# Patient Record
Sex: Male | Born: 1950 | Race: White | Hispanic: No | Smoking: Former smoker
Health system: Southern US, Community
[De-identification: ages and names within clinical notes are randomized; demographics above are authoritative.]

## PROBLEM LIST (undated history)

## (undated) DIAGNOSIS — L8990 Pressure ulcer of unspecified site, unspecified stage: Secondary | ICD-10-CM

## (undated) DIAGNOSIS — E559 Vitamin D deficiency, unspecified: Secondary | ICD-10-CM

## (undated) DIAGNOSIS — G629 Polyneuropathy, unspecified: Secondary | ICD-10-CM

## (undated) DIAGNOSIS — K4090 Unilateral inguinal hernia, without obstruction or gangrene, not specified as recurrent: Secondary | ICD-10-CM

## (undated) DIAGNOSIS — D638 Anemia in other chronic diseases classified elsewhere: Secondary | ICD-10-CM

## (undated) DIAGNOSIS — I10 Essential (primary) hypertension: Secondary | ICD-10-CM

## (undated) DIAGNOSIS — F329 Major depressive disorder, single episode, unspecified: Secondary | ICD-10-CM

## (undated) DIAGNOSIS — I119 Hypertensive heart disease without heart failure: Secondary | ICD-10-CM

## (undated) DIAGNOSIS — E46 Unspecified protein-calorie malnutrition: Secondary | ICD-10-CM

## (undated) DIAGNOSIS — M6282 Rhabdomyolysis: Secondary | ICD-10-CM

## (undated) DIAGNOSIS — J9690 Respiratory failure, unspecified, unspecified whether with hypoxia or hypercapnia: Secondary | ICD-10-CM

## (undated) DIAGNOSIS — K219 Gastro-esophageal reflux disease without esophagitis: Secondary | ICD-10-CM

## (undated) DIAGNOSIS — J449 Chronic obstructive pulmonary disease, unspecified: Secondary | ICD-10-CM

## (undated) DIAGNOSIS — I5020 Unspecified systolic (congestive) heart failure: Secondary | ICD-10-CM

## (undated) DIAGNOSIS — E039 Hypothyroidism, unspecified: Secondary | ICD-10-CM

## (undated) DIAGNOSIS — J189 Pneumonia, unspecified organism: Secondary | ICD-10-CM

## (undated) HISTORY — PX: PEG TUBE PLACEMENT: SUR1034

---

## 2014-05-17 HISTORY — PX: TRACHEOSTOMY: SUR1362

## 2014-12-28 ENCOUNTER — Encounter (HOSPITAL_COMMUNITY): Payer: Self-pay | Admitting: Physician Assistant

## 2014-12-28 ENCOUNTER — Emergency Department (HOSPITAL_COMMUNITY): Payer: Medicare Other

## 2014-12-28 ENCOUNTER — Inpatient Hospital Stay (HOSPITAL_COMMUNITY): Payer: Medicare Other

## 2014-12-28 ENCOUNTER — Inpatient Hospital Stay (HOSPITAL_COMMUNITY)
Admission: EM | Admit: 2014-12-28 | Discharge: 2015-01-15 | DRG: 296 | Disposition: E | Payer: Medicare Other | Attending: Pulmonary Disease | Admitting: Pulmonary Disease

## 2014-12-28 DIAGNOSIS — E872 Acidosis: Secondary | ICD-10-CM | POA: Diagnosis present

## 2014-12-28 DIAGNOSIS — I110 Hypertensive heart disease with heart failure: Secondary | ICD-10-CM | POA: Diagnosis present

## 2014-12-28 DIAGNOSIS — I442 Atrioventricular block, complete: Secondary | ICD-10-CM | POA: Diagnosis present

## 2014-12-28 DIAGNOSIS — J9600 Acute respiratory failure, unspecified whether with hypoxia or hypercapnia: Secondary | ICD-10-CM | POA: Diagnosis present

## 2014-12-28 DIAGNOSIS — E039 Hypothyroidism, unspecified: Secondary | ICD-10-CM | POA: Diagnosis present

## 2014-12-28 DIAGNOSIS — J9601 Acute respiratory failure with hypoxia: Secondary | ICD-10-CM | POA: Diagnosis present

## 2014-12-28 DIAGNOSIS — I5022 Chronic systolic (congestive) heart failure: Secondary | ICD-10-CM | POA: Diagnosis present

## 2014-12-28 DIAGNOSIS — F329 Major depressive disorder, single episode, unspecified: Secondary | ICD-10-CM | POA: Diagnosis present

## 2014-12-28 DIAGNOSIS — K7200 Acute and subacute hepatic failure without coma: Secondary | ICD-10-CM | POA: Diagnosis present

## 2014-12-28 DIAGNOSIS — D638 Anemia in other chronic diseases classified elsewhere: Secondary | ICD-10-CM | POA: Diagnosis present

## 2014-12-28 DIAGNOSIS — E559 Vitamin D deficiency, unspecified: Secondary | ICD-10-CM | POA: Diagnosis present

## 2014-12-28 DIAGNOSIS — Z87891 Personal history of nicotine dependence: Secondary | ICD-10-CM

## 2014-12-28 DIAGNOSIS — Z6820 Body mass index (BMI) 20.0-20.9, adult: Secondary | ICD-10-CM

## 2014-12-28 DIAGNOSIS — J449 Chronic obstructive pulmonary disease, unspecified: Secondary | ICD-10-CM | POA: Diagnosis present

## 2014-12-28 DIAGNOSIS — Z66 Do not resuscitate: Secondary | ICD-10-CM | POA: Diagnosis present

## 2014-12-28 DIAGNOSIS — I469 Cardiac arrest, cause unspecified: Secondary | ICD-10-CM | POA: Diagnosis not present

## 2014-12-28 DIAGNOSIS — J939 Pneumothorax, unspecified: Secondary | ICD-10-CM | POA: Diagnosis not present

## 2014-12-28 DIAGNOSIS — G629 Polyneuropathy, unspecified: Secondary | ICD-10-CM | POA: Diagnosis present

## 2014-12-28 DIAGNOSIS — R570 Cardiogenic shock: Secondary | ICD-10-CM | POA: Diagnosis present

## 2014-12-28 DIAGNOSIS — R579 Shock, unspecified: Secondary | ICD-10-CM

## 2014-12-28 DIAGNOSIS — K219 Gastro-esophageal reflux disease without esophagitis: Secondary | ICD-10-CM | POA: Diagnosis present

## 2014-12-28 DIAGNOSIS — R64 Cachexia: Secondary | ICD-10-CM | POA: Diagnosis present

## 2014-12-28 HISTORY — DX: Gastro-esophageal reflux disease without esophagitis: K21.9

## 2014-12-28 HISTORY — DX: Pressure ulcer of unspecified site, unspecified stage: L89.90

## 2014-12-28 HISTORY — DX: Hypertensive heart disease without heart failure: I11.9

## 2014-12-28 HISTORY — DX: Unspecified systolic (congestive) heart failure: I50.20

## 2014-12-28 HISTORY — DX: Respiratory failure, unspecified, unspecified whether with hypoxia or hypercapnia: J96.90

## 2014-12-28 HISTORY — DX: Hypothyroidism, unspecified: E03.9

## 2014-12-28 HISTORY — DX: Unspecified protein-calorie malnutrition: E46

## 2014-12-28 HISTORY — DX: Major depressive disorder, single episode, unspecified: F32.9

## 2014-12-28 HISTORY — DX: Chronic obstructive pulmonary disease, unspecified: J44.9

## 2014-12-28 HISTORY — DX: Rhabdomyolysis: M62.82

## 2014-12-28 HISTORY — DX: Polyneuropathy, unspecified: G62.9

## 2014-12-28 HISTORY — DX: Anemia in other chronic diseases classified elsewhere: D63.8

## 2014-12-28 HISTORY — DX: Essential (primary) hypertension: I10

## 2014-12-28 HISTORY — DX: Unilateral inguinal hernia, without obstruction or gangrene, not specified as recurrent: K40.90

## 2014-12-28 HISTORY — DX: Vitamin D deficiency, unspecified: E55.9

## 2014-12-28 HISTORY — DX: Pneumonia, unspecified organism: J18.9

## 2014-12-28 LAB — POCT I-STAT 3, ART BLOOD GAS (G3+)
ACID-BASE DEFICIT: 16 mmol/L — AB (ref 0.0–2.0)
Acid-base deficit: 20 mmol/L — ABNORMAL HIGH (ref 0.0–2.0)
BICARBONATE: 10.8 meq/L — AB (ref 20.0–24.0)
BICARBONATE: 12.3 meq/L — AB (ref 20.0–24.0)
O2 Saturation: 96 %
O2 Saturation: 95 %
PCO2 ART: 44.6 mmHg (ref 35.0–45.0)
TCO2: 12 mmol/L (ref 0–100)
TCO2: 13 mmol/L (ref 0–100)
pCO2 arterial: 35.2 mmHg (ref 35.0–45.0)
pH, Arterial: 6.993 — CL (ref 7.350–7.450)
pH, Arterial: 7.133 — CL (ref 7.350–7.450)
pO2, Arterial: 118 mmHg — ABNORMAL HIGH (ref 80.0–100.0)
pO2, Arterial: 84 mmHg (ref 80.0–100.0)

## 2014-12-28 LAB — COMPREHENSIVE METABOLIC PANEL
ALBUMIN: 1.3 g/dL — AB (ref 3.5–5.0)
ALT: 718 U/L — ABNORMAL HIGH (ref 17–63)
AST: 869 U/L — AB (ref 15–41)
Alkaline Phosphatase: 129 U/L — ABNORMAL HIGH (ref 38–126)
Anion gap: 19 — ABNORMAL HIGH (ref 5–15)
BUN: 44 mg/dL — AB (ref 6–20)
CHLORIDE: 111 mmol/L (ref 101–111)
CO2: 14 mmol/L — AB (ref 22–32)
Calcium: 7.2 mg/dL — ABNORMAL LOW (ref 8.9–10.3)
Creatinine, Ser: 1.1 mg/dL (ref 0.61–1.24)
GFR calc Af Amer: >60 mL/min (ref >60)
GLUCOSE: 148 mg/dL — AB (ref 65–99)
POTASSIUM: 4.4 mmol/L (ref 3.5–5.1)
SODIUM: 144 mmol/L (ref 135–145)
Total Bilirubin: 0.8 mg/dL (ref 0.3–1.2)
Total Protein: 4.2 g/dL — ABNORMAL LOW (ref 6.5–8.1)

## 2014-12-28 LAB — I-STAT ARTERIAL BLOOD GAS, ED
Acid-base deficit: 16 mmol/L — ABNORMAL HIGH (ref 0.0–2.0)
BICARBONATE: 14.6 meq/L — AB (ref 20.0–24.0)
O2 Saturation: 96 %
TCO2: 16 mmol/L (ref 0–100)
pCO2 arterial: 53.6 mmHg — ABNORMAL HIGH (ref 35.0–45.0)
pH, Arterial: 7.042 — CL (ref 7.350–7.450)
pO2, Arterial: 113 mmHg — ABNORMAL HIGH (ref 80.0–100.0)

## 2014-12-28 LAB — CBC WITH DIFFERENTIAL/PLATELET
BASOS ABS: 0.1 10*3/uL (ref 0.0–0.1)
Basophils Relative: 1 % (ref 0–1)
EOS ABS: 0 10*3/uL (ref 0.0–0.7)
Eosinophils Relative: 0 % (ref 0–5)
HCT: 30.9 % — ABNORMAL LOW (ref 39.0–52.0)
Hemoglobin: 8.9 g/dL — ABNORMAL LOW (ref 13.0–17.0)
LYMPHS PCT: 7 % — AB (ref 12–46)
Lymphs Abs: 0.8 10*3/uL (ref 0.7–4.0)
MCH: 28.4 pg (ref 26.0–34.0)
MCHC: 28.8 g/dL — ABNORMAL LOW (ref 30.0–36.0)
MCV: 98.7 fL (ref 78.0–100.0)
MONO ABS: 0.3 10*3/uL (ref 0.1–1.0)
Monocytes Relative: 3 % (ref 3–12)
NEUTROS PCT: 89 % — AB (ref 43–77)
Neutro Abs: 9.6 10*3/uL — ABNORMAL HIGH (ref 1.7–7.7)
PLATELETS: 143 10*3/uL — AB (ref 150–400)
RBC: 3.13 MIL/uL — AB (ref 4.22–5.81)
RDW: 17.3 % — AB (ref 11.5–15.5)
WBC: 10.8 10*3/uL — AB (ref 4.0–10.5)

## 2014-12-28 LAB — I-STAT CG4 LACTIC ACID, ED: Lactic Acid, Venous: 14.82 mmol/L (ref 0.5–2.0)

## 2014-12-28 LAB — I-STAT CHEM 8, ED
BUN: 44 mg/dL — AB (ref 6–20)
CALCIUM ION: 0.97 mmol/L — AB (ref 1.13–1.30)
CHLORIDE: 110 mmol/L (ref 101–111)
CREATININE: 0.9 mg/dL (ref 0.61–1.24)
Glucose, Bld: 137 mg/dL — ABNORMAL HIGH (ref 65–99)
HCT: 27 % — ABNORMAL LOW (ref 39.0–52.0)
Hemoglobin: 9.2 g/dL — ABNORMAL LOW (ref 13.0–17.0)
Potassium: 4.3 mmol/L (ref 3.5–5.1)
SODIUM: 144 mmol/L (ref 135–145)
TCO2: 14 mmol/L (ref 0–100)

## 2014-12-28 LAB — LACTIC ACID, PLASMA: LACTIC ACID, VENOUS: 15.5 mmol/L — AB (ref 0.5–2.0)

## 2014-12-28 LAB — TROPONIN I: TROPONIN I: 0.38 ng/mL — AB (ref <0.031)

## 2014-12-28 LAB — MRSA PCR SCREENING: MRSA BY PCR: NEGATIVE

## 2014-12-28 MED ORDER — ONDANSETRON HCL 4 MG/2ML IJ SOLN
4.0000 mg | Freq: Four times a day (QID) | INTRAMUSCULAR | Status: DC | PRN
Start: 2014-12-28 — End: 2014-12-29

## 2014-12-28 MED ORDER — SODIUM BICARBONATE 8.4 % IV SOLN
100.0000 meq | Freq: Once | INTRAVENOUS | Status: AC
  Administered 2014-12-28: 100 meq via INTRAVENOUS
  Filled 2014-12-28: qty 50

## 2014-12-28 MED ORDER — SODIUM CHLORIDE 0.9 % IV SOLN: 250.0000 mL | INTRAVENOUS | Status: DC | PRN

## 2014-12-28 MED ORDER — ASPIRIN 81 MG PO CHEW: 324.0000 mg | CHEWABLE_TABLET | ORAL | Status: AC

## 2014-12-28 MED ORDER — FENTANYL CITRATE (PF) 100 MCG/2ML IJ SOLN
25.0000 ug | INTRAMUSCULAR | Status: DC | PRN
  Administered 2014-12-28 (×2): 50 ug via INTRAVENOUS
  Filled 2014-12-28: qty 2

## 2014-12-28 MED ORDER — SODIUM CHLORIDE 0.9 % IV SOLN
1000.0000 mL | Freq: Once | INTRAVENOUS | Status: AC
  Administered 2014-12-28: 1000 mL via INTRAVENOUS

## 2014-12-28 MED ORDER — IPRATROPIUM-ALBUTEROL 0.5-2.5 (3) MG/3ML IN SOLN
3.0000 mL | Freq: Four times a day (QID) | RESPIRATORY_TRACT | Status: DC
  Administered 2014-12-28: 3 mL via RESPIRATORY_TRACT
  Filled 2014-12-28 (×2): qty 3

## 2014-12-28 MED ORDER — PANTOPRAZOLE SODIUM 40 MG IV SOLR: 40.0000 mg | Freq: Every day | INTRAVENOUS | Status: DC

## 2014-12-28 MED ORDER — DEXTROSE 5 % IV SOLN
2.0000 ug/min | INTRAVENOUS | Status: DC
  Administered 2014-12-28: 30 ug/min via INTRAVENOUS
  Filled 2014-12-28: qty 4

## 2014-12-28 MED ORDER — SODIUM CHLORIDE 0.9 % IV BOLUS (SEPSIS)
1000.0000 mL | Freq: Once | INTRAVENOUS | Status: AC
  Administered 2014-12-28: 1000 mL via INTRAVENOUS

## 2014-12-28 MED ORDER — MIDAZOLAM HCL 2 MG/2ML IJ SOLN
INTRAMUSCULAR | Status: AC
  Administered 2014-12-28: 2 mg
  Filled 2014-12-28: qty 2

## 2014-12-28 MED ORDER — SODIUM CHLORIDE 0.9 % IV SOLN
1000.0000 mL | INTRAVENOUS | Status: DC
  Administered 2014-12-28: 1000 mL via INTRAVENOUS

## 2014-12-28 MED ORDER — NOREPINEPHRINE BITARTRATE 1 MG/ML IV SOLN
5.0000 ug/min | INTRAVENOUS | Status: DC
  Administered 2014-12-28: 45 ug/min via INTRAVENOUS
  Filled 2014-12-28 (×2): qty 16

## 2014-12-28 MED ORDER — SODIUM BICARBONATE 8.4 % IV SOLN
INTRAVENOUS | Status: DC
  Administered 2014-12-28: 23:00:00 via INTRAVENOUS
  Filled 2014-12-28 (×3): qty 150

## 2014-12-28 MED ORDER — ASPIRIN 300 MG RE SUPP
300.0000 mg | RECTAL | Status: AC
  Administered 2014-12-28: 300 mg via RECTAL
  Filled 2014-12-28: qty 1

## 2014-12-28 MED ORDER — MIDAZOLAM HCL 2 MG/2ML IJ SOLN
1.0000 mg | INTRAMUSCULAR | Status: DC | PRN
  Filled 2014-12-28 (×2): qty 2

## 2014-12-28 MED ORDER — PANTOPRAZOLE SODIUM 40 MG IV SOLR
40.0000 mg | Freq: Two times a day (BID) | INTRAVENOUS | Status: DC
  Administered 2014-12-28: 40 mg via INTRAVENOUS
  Filled 2014-12-28: qty 40

## 2014-12-28 MED ORDER — SODIUM BICARBONATE 8.4 % IV SOLN
INTRAVENOUS | Status: AC
  Filled 2014-12-28: qty 50

## 2014-12-28 MED ORDER — NOREPINEPHRINE BITARTRATE 1 MG/ML IV SOLN
5.0000 ug/min | INTRAVENOUS | Status: DC
  Filled 2014-12-28: qty 4

## 2014-12-28 NOTE — H&P (Signed)
PULMONARY / CRITICAL CARE MEDICINE   Name: Tim Moore MRN: [id number] DOB: 11-Jul-1950    ADMISSION DATE:  12/19/2014 CONSULTATION DATE:  01/12/2015  REFERRING MD :  ED  CHIEF COMPLAINT:  Cardiogenic Shock  INITIAL PRESENTATION: Patient was found down for an unclear duration of time at his long-term acute care facility ([facility]). Patient underwent approximately 6 minutes of CPR with epinephrine prior to resuscitation.Patient was transferred to our facility after endotracheal intubation, placement of right femoral central venous line, and initiation of transcutaneous pacing.  STUDIES:  Portable CXR 9/13 - Moderate left pneumothorax.  SIGNIFICANT EVENTS: 9/13 - Found down unclear duration 9/13 - Cardiogenic shock requiring transcutaneous pacing & vasopressors 9/13 - Left Chest tube placed for pneumothorax on ventilator  HISTORY OF PRESENT ILLNESS:  Patient's history obtained from his medical records copy from his long-term acute care facility. I also obtained history from the patient's brother and mother via phone this evening. Patient has a known history of depression and apparently had a gradual decline in strength and conditioning over approximately 3 months prior to admission to came the hospital and will also [place] in February 2016. The patient was found lying on the floor the morning of admission and was noted to be in rhabdomyolysis. His course was complicated by respiratory failure from Pseudomonas pneumonia and prolonged ventilation requiring placement of tracheostomy as well as percutaneous gastrostomy tube. The patient did have surgical revision of his tracheostomy since admission to his long-term acute care facility. Also the patient has previously underwent debridement of his sacral decubitus ulcer by general surgery. Per the patient's family he was speaking to them via phone and they visited infrequently. Per their report he was not getting out of bed or walking on his  own. The patient was found down for an unclear duration of time and after resuscitation and arrival by EMS transcutaneous pacing was initiated. He was subsequently transferred to our facility for further acute care needs. On arrival the patient was in a state of shock and hypoxic respiratory failure. Patient was on vasopressor support.  PAST MEDICAL HISTORY :  Past Medical History  Diagnosis Date  . PNA (pneumonia)   . Decubitus skin ulcer   . Vitamin D deficiency   . GERD (gastroesophageal reflux disease)   . Hypothyroid   . Respiratory failure   . Hypertensive heart disease   . Rhabdomyolysis     During Hospital Admission to [place], [place]  . Systolic CHF   . Peripheral neuropathy   . Protein calorie malnutrition   . COPD (chronic obstructive pulmonary disease)   . Anemia of chronic disease   . Major depression   . Hypertension   . Inguinal hernia    SURGICAL HISTORY: Past Surgical History  Procedure Laterality Date  . Tracheostomy  05/2014    later surgically closed  . Peg tube placement     Prior to Admission medications   Medication Sig Start Date End Date Taking? Authorizing Provider  acetaminophen (TYLENOL) 650 MG CR tablet 650 mg by PEG Tube route every 12 (twelve) hours.   Yes Historical Provider, MD  antiseptic oral rinse (BIOTENE) LIQD 15 mLs by Mouth Rinse route as needed for dry mouth (secretions).   Yes Historical Provider, MD  Cholecalciferol (VITAMIN D) 2000 UNITS CAPS 2,000 Units by PEG Tube route daily.   Yes Historical Provider, MD  clonazePAM (KLONOPIN) 0.25 MG disintegrating tablet 0.25 mg by PEG Tube route 2 (two) times daily.   Yes Historical Provider, MD  famotidine (PEPCID) 20 MG tablet 20 mg by PEG Tube route 2 (two) times daily.   Yes Historical Provider, MD  FentaNYL 37.5 MCG/HR PT72 Place 1 patch onto the skin every 3 (three) days.   Yes Historical Provider, MD  ferrous sulfate 300 (60 FE) MG/5ML syrup Place 300 mg into feeding tube daily.   Yes  Historical Provider, MD  glycopyrrolate (ROBINUL) 2 MG tablet 2 mg by PEG Tube route 2 (two) times daily.   Yes Historical Provider, MD  ibuprofen (ADVIL,MOTRIN) 200 MG tablet Take 200 mg by mouth 2 (two) times daily.   Yes Historical Provider, MD  levothyroxine (SYNTHROID, LEVOTHROID) 75 MCG tablet 75 mcg by PEG Tube route daily before breakfast.   Yes Historical Provider, MD  magnesium hydroxide (MILK OF MAGNESIA) 400 MG/5ML suspension Take 30 mLs by mouth daily as needed for mild constipation.   Yes Historical Provider, MD  metoCLOPramide (REGLAN) 10 MG tablet 10 mg by PEG Tube route every 8 (eight) hours.   Yes Historical Provider, MD  metoprolol tartrate (LOPRESSOR) 25 MG tablet 12.5 mg by PEG Tube route 2 (two) times daily.   Yes Historical Provider, MD  morphine 10 MG/5ML solution Take 15 mg by mouth every 6 (six) hours.   Yes Historical Provider, MD  morphine 2 MG/ML injection Inject 2 mg into the muscle every 4 (four) hours as needed (wound care).   Yes Historical Provider, MD  Multiple Vitamins-Minerals (MULTIVITAMIN WITH MINERALS) tablet 1 tablet by PEG Tube route daily.   Yes Historical Provider, MD  potassium chloride 20 MEQ/15ML (10%) SOLN 20 mEq by PEG Tube route 2 (two) times daily.   Yes Historical Provider, MD  pregabalin (LYRICA) 150 MG capsule 150 mg by PEG Tube route 2 (two) times daily.   Yes Historical Provider, MD  sertraline (ZOLOFT) 20 MG/ML concentrated solution Place 50 mg into feeding tube daily.   Yes Historical Provider, MD  silver nitrate applicators 75-25 % applicator Apply 1 application topically as needed for bleeding.   Yes Historical Provider, MD  sodium hypochlorite (DAKIN'S 1/4 STRENGTH) 0.125 % SOLN Apply 1 application topically daily.   Yes Historical Provider, MD  tiZANidine (ZANAFLEX) 4 MG tablet 4 mg by PEG Tube route 3 (three) times daily.   Yes Historical Provider, MD   Allergies  Allergen Reactions  . Penicillins     FAMILY HISTORY:  Family  History  Problem Relation Age of Onset  . Lung cancer    . Breast cancer     SOCIAL HISTORY: Social History   Social History  . Marital Status: N/A    Spouse Name: N/A  . Number of Children: N/A  . Years of Education: N/A   Social History Main Topics  . Smoking status: Former Smoker -- 1.00 packs/day for 40 years    Types: Cigarettes    Quit date: 05/17/2014  . Smokeless tobacco: Never Used  . Alcohol Use: Not on file  . Drug Use: Not on file  . Sexual Activity: Not on file   Other Topics Concern  . Not on file   Social History Narrative   Admitted to [facility] from [place], [place]. Patient previously living at home with brother & mother.    REVIEW OF SYSTEMS:  Unobtainable as the patient is currently intubated.  SUBJECTIVE:   VITAL SIGNS: Pulse Rate:  [70-72] 72 (09/13 2135) Resp:  [26-38] 33 (09/13 2135) BP: (56-186)/(15-135) 83/39 mmHg (09/13 1953) SpO2:  [80 %-100 %] 95 % (09/13 2135) Arterial  Line BP: (92-114)/(46-65) 103/57 mmHg (09/13 2016) FiO2 (%):  [100 %] 100 % (09/13 2142) Weight:  [61.8 kg (136 lb 3.9 oz)] 61.8 kg (136 lb 3.9 oz) (09/13 2100) HEMODYNAMICS:   VENTILATOR SETTINGS: Vent Mode:  [-] PRVC FiO2 (%):  [100 %] 100 % Set Rate:  [16 bmp-30 bmp] 30 bmp Vt Set:  [550 mL] 550 mL PEEP:  [5 cmH20] 5 cmH20 Plateau Pressure:  [20 cmH20-28 cmH20] 22 cmH20 INTAKE / OUTPUT: No intake or output data in the 24 hours ending 01/10/2015 2154  PHYSICAL EXAMINATION: General: No acute distress. Laying recumbent in bed on ventilator. No spontaneous movements.  Integument:  Warm & dry. No rash on exposed skin. Bruising of various ages on exposed extremities. Multiple decubiti on the patient's extremities as well as on the sacrum. Lymphatics:  No appreciated cervical or supraclavicular lymphadenoapthy. HEENT:  tachycardia mucus membranes. Endotracheal tube in place. Pupils equal. Cardiovascular:  Regular rate and transcutaneously paced. No edema.  Pulmonary:   Decreased breath sounds on the left. Absence of lung sliding on bedside thoracic ultrasound on the left. Currently on 1.0 FiO2. Abdomen: Soft. Normal bowel sounds. Nondistended. Large inguinal hernia noted within scrotum. Musculoskeletal:  Patient had plantarflexion contractures of bilateral feet. No obvious joint deformity. Neurological:  Patient not following commands. Grossly symmetric face. After my chest tube insertion he did attempt to make one brief movement with his left side. Psychiatric:  Patient is non-communicative. Did receive IV sedative by EMS.  LABS:  CBC  Recent Labs Lab 01/07/2015 1824 01/02/2015 1825  WBC 10.8*  --   HGB 8.9* 9.2*  HCT 30.9* 27.0*  PLT 143*  --    Coag's No results for input(s): APTT, INR in the last 168 hours. BMET  Recent Labs Lab 01/01/2015 1824 01/02/2015 1825  NA 144 144  K 4.4 4.3  CL 111 110  CO2 14*  --   BUN 44* 44*  CREATININE 1.10 0.90  GLUCOSE 148* 137*   Electrolytes  Recent Labs Lab 01/04/2015 1824  CALCIUM 7.2*   Sepsis Markers  Recent Labs Lab 01/08/2015 1826  LATICACIDVEN 14.82*   ABG  Recent Labs Lab 01/07/2015 1904 12/21/2014 2131  PHART 7.042* 6.993*  PCO2ART 53.6* 44.6  PO2ART 113.0* 118.0*   Liver Enzymes  Recent Labs Lab 01/10/2015 1824  AST 869*  ALT 718*  ALKPHOS 129*  BILITOT 0.8  ALBUMIN 1.3*   Cardiac Enzymes No results for input(s): TROPONINI, PROBNP in the last 168 hours. Glucose No results for input(s): GLUCAP in the last 168 hours.  Imaging Ct Head Wo Contrast  01/11/2015   CLINICAL DATA:  64 year old male status post cardiac arrest and CPR  EXAM: CT HEAD WITHOUT CONTRAST  TECHNIQUE: Contiguous axial images were obtained from the base of the skull through the vertex without intravenous contrast.  COMPARISON:  None.  FINDINGS: There is slight prominence of the ventricles and sulci compatible with age-related volume loss. Mild periventricular and deep white matter hypodensities represent  chronic microvascular ischemic changes. There is no intracranial hemorrhage. No mass effect or midline shift identified.  There is partial opacification of the sphenoid sinuses with minimal mucoperiosteal thickening of the left maxillary sinus. The calvarium is intact.  IMPRESSION: No acute intracranial pathology.  Mild age-related atrophy and chronic microvascular ischemic disease.  If symptoms persist and there are no contraindications, MRI may provide better evaluation if clinically indicated   Electronically Signed   By: Elgie Collard M.D.   On: 12/17/2014 20:55  Dg Chest Portable 1 View  2015/01/05   CLINICAL DATA:  64 year old male with cardiac arrest and chest tube placement.  EXAM: PORTABLE CHEST - 1 VIEW  COMPARISON:  Earlier radiograph dated 01-05-2015  FINDINGS: An endotracheal tube is noted with tip in stable position above the carina. There has been interval placement of a pigtail chest tube with tip projecting over the mediastinum at the level of the carina. There has been interval improvement of the previously seen pneumothorax with probable small residual pneumothorax at the left apex. Follow-up recommended.  Bilateral patchy airspace opacity again noted similar to prior study. Stable cardiac silhouette. The osseous structures are grossly unremarkable. Left chest wall soft tissue emphysema adjacent to the entry of the chest tube.  IMPRESSION: Interval placement of a chest tube with tip projecting over the mediastinum. There has been interval decrease in the size of the left pneumothorax with possible residual pneumothorax at the left apical region. Follow-up recommended.   Electronically Signed   By: Elgie Collard M.D.   On: 05-Jan-2015 20:03   Dg Chest Port 1 View  2015-01-05   CLINICAL DATA:  Post CPR with intubation.  EXAM: PORTABLE CHEST - 1 VIEW  COMPARISON:  None.  FINDINGS: Endotracheal tube is present with tip approximately 5.2 cm above the carina. Lungs are hypoinflated with  bilateral central mixed interstitial airspace density which may be partially chronic although cannot exclude acute component of edema versus infection. Small to moderate left-sided pneumothorax. Cardiomediastinal silhouette is within normal. No definite rib fractures seen.  IMPRESSION: Small to moderate left-sided pneumothorax.  Mixed interstitial airspace density over the central lungs which may be partly chronic although cannot exclude an acute process such as edema or infection.  Endotracheal tube with tip 5.2 cm above the carina.  Critical Value/emergent results were called by telephone at the time of interpretation on 01/05/2015 at 6:12 pm to Dr. Jesusita Oka FLOYD's , nurse, Brigid Re, who verbally acknowledged these results.   Electronically Signed   By: Elberta Fortis M.D.   On: 01/05/2015 18:13     ASSESSMENT / PLAN:  PULMONARY OETT 9/13>> L 52fr Pigtail CT 9/13>> A: Acute Hypoxic Respiratory Failure Left Pneumothorax - tube overlying mediastinum on CXR  P:   Chest to 20 cm wall suction Stat chest CT without contrast to confirm tube in position Vent bundle Duonebs every 6 hours  CARDIOVASCULAR R Femoral CVL (placed OSH) 9/13>> L Femoral Art Line 9/13>>> A:  Cardiogenic shock Transcutaneous pacing  P:  Cardiology consulted. Continuing transcutaneous pacing. Echocardiogram ordered. Continuing norepinephrine to maintain MAP >65  RENAL A:   Lactic acidosis.  P:   Trending lactic acid every 6 hours. Monitor urine output with Foley catheter. Monitor renal function with daily BUN/creatinine. Monitoring electrolytes daily. Starting bicarbonate drip.  GASTROINTESTINAL A:   Inguinal hernia. No acute issues.  P:   Nothing by mouth Protonix IV daily  HEMATOLOGIC A:   H/O Anemia of chronic disease - no signs of active bleeding.  P:  SCDs  INFECTIOUS A:  No acute issues.  P:   Plan to perform cultures for leukocytosis/fever.  ENDOCRINE A:   No acute issues.     P:   Monitor blood glucose on daily BMP.  NEUROLOGIC A:  Altered mental status - likely secondary to acute anoxic brain injury  P:   RASS goal: 0 Versed IV when necessary Fentanyl IV when necessary Neuro checks every hour   FAMILY  - Updates: I have updated the patient's  brother Tim Moore [phone]) via phone twice this evening. I also had the opportunity to speak with the patient's mother. I explained to the patient has worsening lactic acidosis likely due to poor perfusion in the state of cardiogenic shock. Patient is currently being transcutaneously paced. I explained that given the severity of his current clinical state and severe debilitation with multiple medical problems I do not expect him to survive this admission.  His brother has agreed that he would not wish to live like this but would like to continue maintaining her current level of care at this time. The patient will be made DO NOT RESUSCITATE at this time in the event of full cardiac arrest.  - Inter-disciplinary family meet or Palliative Care meeting due by:  9/19  TODAY'S SUMMARY: 63 year old male admitted after cardiac arrest being found down for unclear rate of time and undergoing approximately 6 minutes of CPR prior to return of spontaneous circulation. Patient had a left-sided pneumothorax on positive pressure ventilation and a left-sided percutaneous chest tube was emergently placed by myself. Given the need for assessment of arterial pH and ventilation as well as oxygenation a left femoral arterial line was emergently placed. The patient at this time is DO NOT RESUSCITATE and we will not be escalating vasopressor support or transcutaneous pacemaker support at this time. Plan for wound care consult for patient's decubiti if he survives the night.  I have spent a total of 47 minutes of critical care time today independent of procedures in the care of this patient, reviewing the patient's electronic and transfer  medical records, & also having repeated family discussions and updates regarding CODE STATUS with the patient's brother via phone.  Donna Christen Jamison Neighbor, M.D. [facility] Pulmonary [facility] Critical Care Pager:  [phone] After 3pm or if no response, call [phone] 01/01/2015, 9:54 PM

## 2014-12-28 NOTE — ED Notes (Signed)
Pt here from [facility] nursing home post cpr , pt was an unwitnessed arrest  Pt received 2 epis 1 atropine , 1 bicarb, and approx of cpr , pt intubated at [facility], EMS gave d 50 and paced pt  Due to third degree block, pt arrived to ED intubated and being paced

## 2014-12-28 NOTE — ED Notes (Signed)
Pt to go to ct and then 2heart.

## 2014-12-28 NOTE — Procedures (Signed)
Arterial Line Insertion  Consent: Procedure was performed emergently due to inaccuracy of cough pressure, need of arterial blood gas analysis, & symptomatic bradycardia requiring transcutaneous pacing.  Location:  Left Femoral Artery  Medication: Local anesthesia with 5 mL of lidocaine 1%  Description of Procedure: Patient was prepped and draped in sterile fashion left femoral artery palpated. Local anesthesia applied subcutaneously. Needle inserted in the left femoral artery with pulsatile blood flow observed. Guidewire then inserted through the needle. Femoral arterial catheter inserted over guidewire and guidewire removed. Femoral arterial catheter sewn into place. Site dressed in sterile fashion. Arterial waveform transduced.

## 2014-12-28 NOTE — ED Notes (Signed)
Attempted report 

## 2014-12-28 NOTE — Progress Notes (Signed)
CRITICAL VALUE ALERT  Critical value received:  Lactic acid  Date of notification:  12/25/2014  Time of notification:  2130  Critical value read back:Yes.    Nurse who received alert:  Alycia Rossetti   MD notified (1st page):  E-Link MD  Time of first page: 2135    Responding MD:  E-Link MD  Time MD responded:  2135

## 2014-12-28 NOTE — ED Provider Notes (Signed)
CSN: [id number]     Arrival date & time 12/20/2014  1729 History   First MD Initiated Contact with Patient 12/26/2014 1736     Chief Complaint  Patient presents with  . Cardiac Arrest     (Consider location/radiation/quality/duration/timing/severity/associated sxs/prior Treatment) HPI  Level V caveat unable to provide history patient is intubated  Per history patient was at [facility] after he was at a prolonged ventilator required state. Patient had his trach removed about a week ago. Was found to be pulseless today. CPR for about 10 minutes with ROSC, patient was intubated had a central line placed and then was transferred here. Patient bradycardic after CPR and given atropine. EMS in route felt the patient was in third-degree heart block and started pacing him. Past Medical History  Diagnosis Date  . PNA (pneumonia)   . Decubitus skin ulcer   . Vitamin D deficiency   . GERD (gastroesophageal reflux disease)   . Hypothyroid   . Respiratory failure   . Hypertensive heart disease   . Rhabdomyolysis     During Hospital Admission to [place], [place]  . Systolic CHF   . Peripheral neuropathy   . Protein calorie malnutrition   . COPD (chronic obstructive pulmonary disease)   . Anemia of chronic disease   . Major depression   . Hypertension   . Inguinal hernia    Past Surgical History  Procedure Laterality Date  . Tracheostomy  05/2014    later surgically closed  . Peg tube placement     Family History  Problem Relation Age of Onset  . Lung cancer    . Breast cancer     Social History  Substance Use Topics  . Smoking status: Former Smoker -- 1.00 packs/day for 40 years    Types: Cigarettes    Quit date: 05/17/2014  . Smokeless tobacco: Never Used  . Alcohol Use: Not on file    Review of Systems  Level V caveat unable to obtain secondary to patient being intubated  Allergies  Penicillins  Home Medications   Prior to Admission medications   Medication Sig  Start Date End Date Taking? Authorizing Provider  acetaminophen (TYLENOL) 650 MG CR tablet 650 mg by PEG Tube route every 12 (twelve) hours.   Yes Historical Provider, MD  antiseptic oral rinse (BIOTENE) LIQD 15 mLs by Mouth Rinse route as needed for dry mouth (secretions).   Yes Historical Provider, MD  Cholecalciferol (VITAMIN D) 2000 UNITS CAPS 2,000 Units by PEG Tube route daily.   Yes Historical Provider, MD  clonazePAM (KLONOPIN) 0.25 MG disintegrating tablet 0.25 mg by PEG Tube route 2 (two) times daily.   Yes Historical Provider, MD  famotidine (PEPCID) 20 MG tablet 20 mg by PEG Tube route 2 (two) times daily.   Yes Historical Provider, MD  FentaNYL 37.5 MCG/HR PT72 Place 1 patch onto the skin every 3 (three) days.   Yes Historical Provider, MD  ferrous sulfate 300 (60 FE) MG/5ML syrup Place 300 mg into feeding tube daily.   Yes Historical Provider, MD  glycopyrrolate (ROBINUL) 2 MG tablet 2 mg by PEG Tube route 2 (two) times daily.   Yes Historical Provider, MD  ibuprofen (ADVIL,MOTRIN) 200 MG tablet Take 200 mg by mouth 2 (two) times daily.   Yes Historical Provider, MD  levothyroxine (SYNTHROID, LEVOTHROID) 75 MCG tablet 75 mcg by PEG Tube route daily before breakfast.   Yes Historical Provider, MD  magnesium hydroxide (MILK OF MAGNESIA) 400 MG/5ML suspension  Take 30 mLs by mouth daily as needed for mild constipation.   Yes Historical Provider, MD  metoCLOPramide (REGLAN) 10 MG tablet 10 mg by PEG Tube route every 8 (eight) hours.   Yes Historical Provider, MD  metoprolol tartrate (LOPRESSOR) 25 MG tablet 12.5 mg by PEG Tube route 2 (two) times daily.   Yes Historical Provider, MD  morphine 10 MG/5ML solution Take 15 mg by mouth every 6 (six) hours.   Yes Historical Provider, MD  morphine 2 MG/ML injection Inject 2 mg into the muscle every 4 (four) hours as needed (wound care).   Yes Historical Provider, MD  Multiple Vitamins-Minerals (MULTIVITAMIN WITH MINERALS) tablet 1 tablet by PEG  Tube route daily.   Yes Historical Provider, MD  potassium chloride 20 MEQ/15ML (10%) SOLN 20 mEq by PEG Tube route 2 (two) times daily.   Yes Historical Provider, MD  pregabalin (LYRICA) 150 MG capsule 150 mg by PEG Tube route 2 (two) times daily.   Yes Historical Provider, MD  sertraline (ZOLOFT) 20 MG/ML concentrated solution Place 50 mg into feeding tube daily.   Yes Historical Provider, MD  silver nitrate applicators 75-25 % applicator Apply 1 application topically as needed for bleeding.   Yes Historical Provider, MD  sodium hypochlorite (DAKIN'S 1/4 STRENGTH) 0.125 % SOLN Apply 1 application topically daily.   Yes Historical Provider, MD  tiZANidine (ZANAFLEX) 4 MG tablet 4 mg by PEG Tube route 3 (three) times daily.   Yes Historical Provider, MD   BP 101/49 mmHg  Pulse 72  Temp(Src) 93.5 F (34.2 C) (Rectal)  Resp 35  Ht 5\' 8"  (1.727 m)  Wt 136 lb 3.9 oz (61.8 kg)  BMI 20.72 kg/m2  SpO2 95% Physical Exam  Constitutional:  Cachectic  HENT:  Head: Normocephalic and atraumatic.  Old trach scar in place  Eyes: EOM are normal. Pupils are equal, round, and reactive to light.  Neck: Normal range of motion. Neck supple. No JVD present.  Cardiovascular: Normal rate and regular rhythm.  Exam reveals no gallop and no friction rub.   No murmur heard. Pulmonary/Chest: No respiratory distress. He has no wheezes.  Diminished breath sounds on the left  Abdominal: He exhibits mass (large indirect inguinal hernia). He exhibits no distension. There is no tenderness. There is no rebound and no guarding.  Genitourinary:  Blood at the urinary meatus  Musculoskeletal: Normal range of motion.  Neurological: He is unresponsive. GCS eye subscore is 1. GCS verbal subscore is 1. GCS motor subscore is 4.  Patient with some what appears to be involuntary movements. Pupils are 4+ dilated nonresponsive.  Skin: No rash noted. No pallor.  Psychiatric: He has a normal mood and affect. His behavior is  normal.    ED Course  Procedures (including critical care time) Labs Review Labs Reviewed  CBC WITH DIFFERENTIAL/PLATELET - Abnormal; Notable for the following:    WBC 10.8 (*)    RBC 3.13 (*)    Hemoglobin 8.9 (*)    HCT 30.9 (*)    MCHC 28.8 (*)    RDW 17.3 (*)    Platelets 143 (*)    Neutrophils Relative % 89 (*)    Lymphocytes Relative 7 (*)    Neutro Abs 9.6 (*)    All other components within normal limits  COMPREHENSIVE METABOLIC PANEL - Abnormal; Notable for the following:    CO2 14 (*)    Glucose, Bld 148 (*)    BUN 44 (*)    Calcium 7.2 (*)  Total Protein 4.2 (*)    Albumin 1.3 (*)    AST 869 (*)    ALT 718 (*)    Alkaline Phosphatase 129 (*)    Anion gap 19 (*)    All other components within normal limits  TROPONIN I - Abnormal; Notable for the following:    Troponin I 0.38 (*)    All other components within normal limits  LACTIC ACID, PLASMA - Abnormal; Notable for the following:    Lactic Acid, Venous 15.5 (*)    All other components within normal limits  I-STAT CHEM 8, ED - Abnormal; Notable for the following:    BUN 44 (*)    Glucose, Bld 137 (*)    Calcium, Ion 0.97 (*)    Hemoglobin 9.2 (*)    HCT 27.0 (*)    All other components within normal limits  I-STAT CG4 LACTIC ACID, ED - Abnormal; Notable for the following:    Lactic Acid, Venous 14.82 (*)    All other components within normal limits  I-STAT ARTERIAL BLOOD GAS, ED - Abnormal; Notable for the following:    pH, Arterial 7.042 (*)    pCO2 arterial 53.6 (*)    pO2, Arterial 113.0 (*)    Bicarbonate 14.6 (*)    Acid-base deficit 16.0 (*)    All other components within normal limits  POCT I-STAT 3, ART BLOOD GAS (G3+) - Abnormal; Notable for the following:    pH, Arterial 6.993 (*)    pO2, Arterial 118.0 (*)    Bicarbonate 10.8 (*)    Acid-base deficit 20.0 (*)    All other components within normal limits  POCT I-STAT 3, ART BLOOD GAS (G3+) - Abnormal; Notable for the following:    pH,  Arterial 7.133 (*)    Bicarbonate 12.3 (*)    Acid-base deficit 16.0 (*)    All other components within normal limits  MRSA PCR SCREENING  BLOOD GAS, ARTERIAL  BLOOD GAS, ARTERIAL  TROPONIN I  TROPONIN I  LACTIC ACID, PLASMA  LACTIC ACID, PLASMA  COMPREHENSIVE METABOLIC PANEL  PHOSPHORUS  CBC WITH DIFFERENTIAL/PLATELET  MAGNESIUM  BLOOD GAS, ARTERIAL  I-STAT CG4 LACTIC ACID, ED    Imaging Review Ct Head Wo Contrast  27-Jan-2015   CLINICAL DATA:  64 year old male status post cardiac arrest and CPR  EXAM: CT HEAD WITHOUT CONTRAST  TECHNIQUE: Contiguous axial images were obtained from the base of the skull through the vertex without intravenous contrast.  COMPARISON:  None.  FINDINGS: There is slight prominence of the ventricles and sulci compatible with age-related volume loss. Mild periventricular and deep white matter hypodensities represent chronic microvascular ischemic changes. There is no intracranial hemorrhage. No mass effect or midline shift identified.  There is partial opacification of the sphenoid sinuses with minimal mucoperiosteal thickening of the left maxillary sinus. The calvarium is intact.  IMPRESSION: No acute intracranial pathology.  Mild age-related atrophy and chronic microvascular ischemic disease.  If symptoms persist and there are no contraindications, MRI may provide better evaluation if clinically indicated   Electronically Signed   By: Elgie Collard M.D.   On: 2015/01/27 20:55   Dg Chest Portable 1 View  01-27-2015   CLINICAL DATA:  64 year old male with cardiac arrest and chest tube placement.  EXAM: PORTABLE CHEST - 1 VIEW  COMPARISON:  Earlier radiograph dated 2015/01/27  FINDINGS: An endotracheal tube is noted with tip in stable position above the carina. There has been interval placement of a pigtail chest tube  with tip projecting over the mediastinum at the level of the carina. There has been interval improvement of the previously seen pneumothorax with  probable small residual pneumothorax at the left apex. Follow-up recommended.  Bilateral patchy airspace opacity again noted similar to prior study. Stable cardiac silhouette. The osseous structures are grossly unremarkable. Left chest wall soft tissue emphysema adjacent to the entry of the chest tube.  IMPRESSION: Interval placement of a chest tube with tip projecting over the mediastinum. There has been interval decrease in the size of the left pneumothorax with possible residual pneumothorax at the left apical region. Follow-up recommended.   Electronically Signed   By: Elgie Collard M.D.   On: 12/17/2014 20:03   Dg Chest Port 1 View  12/20/2014   CLINICAL DATA:  Post CPR with intubation.  EXAM: PORTABLE CHEST - 1 VIEW  COMPARISON:  None.  FINDINGS: Endotracheal tube is present with tip approximately 5.2 cm above the carina. Lungs are hypoinflated with bilateral central mixed interstitial airspace density which may be partially chronic although cannot exclude acute component of edema versus infection. Small to moderate left-sided pneumothorax. Cardiomediastinal silhouette is within normal. No definite rib fractures seen.  IMPRESSION: Small to moderate left-sided pneumothorax.  Mixed interstitial airspace density over the central lungs which may be partly chronic although cannot exclude an acute process such as edema or infection.  Endotracheal tube with tip 5.2 cm above the carina.  Critical Value/emergent results were called by telephone at the time of interpretation on 12/17/2014 at 6:12 pm to Dr. Jesusita Oka Magaline Steinberg's , nurse, Brigid Re, who verbally acknowledged these results.   Electronically Signed   By: Elberta Fortis M.D.   On: 01/08/2015 18:13   I have personally reviewed and evaluated these images and lab results as part of my medical decision-making.   EKG Interpretation None      MDM   Final diagnoses:  Cardiac arrest  Pneumothorax on left    64 yo M status post cardiac arrest. ET tube  in correct position on chest x-ray. Pneumothorax on the left. Oxygenating well. Transition from a dirty epinephrine drip to a norepinephrine drip.  Patient without any QRS complexes when pacing stopped. Patient with recurrent P waves. Discussed with cardiology will evaluate bedside. Discussed case with critical care. Crit care will put in a arterial line and a chest tube.  CRITICAL CARE Performed by: Rae Roam   Total critical care time: 35 min  Critical care time was exclusive of separately billable procedures and treating other patients.  Critical care was necessary to treat or prevent imminent or life-threatening deterioration.  Critical care was time spent personally by me on the following activities: development of treatment plan with patient and/or surrogate as well as nursing, discussions with consultants, evaluation of patient's response to treatment, examination of patient, obtaining history from patient or surrogate, ordering and performing treatments and interventions, ordering and review of laboratory studies, ordering and review of radiographic studies, pulse oximetry and re-evaluation of patient's condition.   The patients results and plan were reviewed and discussed.   Any x-rays performed were independently reviewed by myself.   Differential diagnosis were considered with the presenting HPI.  Medications  0.9 %  sodium chloride infusion (0 mLs Intravenous Stopped 12/22/2014 1909)    Followed by  0.9 %  sodium chloride infusion (1,000 mLs Intravenous New Bag/Given 01/05/2015 1909)    Followed by  0.9 %  sodium chloride infusion (1,000 mLs Intravenous New Bag/Given 12/30/2014 2125)  0.9 %  sodium chloride infusion (not administered)  ondansetron (ZOFRAN) injection 4 mg (not administered)  ipratropium-albuterol (DUONEB) 0.5-2.5 (3) MG/3ML nebulizer solution 3 mL (3 mLs Nebulization Given 01-03-15 2000)  fentaNYL (SUBLIMAZE) injection 25-75 mcg (50 mcg Intravenous Given  01-03-15 2349)  midazolam (VERSED) injection 1-3 mg (not administered)  pantoprazole (PROTONIX) injection 40 mg (40 mg Intravenous Given 01-03-2015 2324)  norepinephrine (LEVOPHED) 16 mg in dextrose 5 % 250 mL (0.064 mg/mL) infusion (50 mcg/min Intravenous Rate/Dose Change 2015-01-03 2203)  sodium bicarbonate 150 mEq in dextrose 5 % 1,000 mL infusion ( Intravenous New Bag/Given 2015/01/03 2240)  chlorhexidine gluconate (PERIDEX) 0.12 % solution 15 mL (not administered)  antiseptic oral rinse solution (CORINZ) (not administered)  sodium chloride 0.9 % bolus 1,000 mL (0 mLs Intravenous Stopped Jan 03, 2015 1907)  midazolam (VERSED) 2 MG/2ML injection (2 mg  Given 01-03-15 1842)  aspirin chewable tablet 324 mg ( Oral See Alternative 03-Jan-2015 2000)    Or  aspirin suppository 300 mg (300 mg Rectal Given January 03, 2015 2000)  sodium bicarbonate injection 100 mEq (100 mEq Intravenous Given 03-Jan-2015 2210)  sodium bicarbonate 1 mEq/mL injection (  Duplicate 01/03/2015 2230)    Filed Vitals:   01/03/2015 2135 03-Jan-2015 2155 2015/01/03 2200 01-03-15 2329  BP:    101/49  Pulse: 72 72 70 72  Temp:  93.5 F (34.2 C)    TempSrc:  Rectal    Resp: 33  35 35  Height:      Weight:      SpO2: 95%  90% 95%    Final diagnoses:  Cardiac arrest  Pneumothorax on left    Admission/ observation were discussed with the admitting physician, patient and/or family and they are comfortable with the plan.    Melene Plan, DO 12/21/2014 1610

## 2014-12-28 NOTE — Consult Note (Signed)
CARDIOLOGY CONSULT NOTE   Patient ID: Tim Moore MRN: [id number] DOB/AGE: 64/23/1952 64 y.o.  Admit date: Jan 12, 2015  Primary Physician   Dr Petra Kuba at [facility]   New Reason for Consultation   CHB  Tim Moore is a 64 y.o. year old male with a history as below. He was initially admitted to the hospital in [place], [place] after being found down on 05/17/2014. He was admitted to [facility] 07/15/2014 although another H&P is dated 06/08/2014.  Today, he was found down by staff and CPR was initiated. The down time is unknown. He had CPR for about 10 minutes, with a R femoral line place, and received 2 epis, 1 atropine, and 1 bicarb. Shocks are not mentioned. EMS gave 1 amp D50 due to hypoglycemia. No further details are available.  He was in 3rd degree heart block and was transcutaneously paced. No strips are available and when the pacer is not capturing, he loses pulses.   He is currently on Levophed and was on an epinephrine drip on arrival to the ER, but this has been turned off.     Past Medical History  Diagnosis Date  . PNA (pneumonia)   . Decubitus skin ulcer   . Vitamin D deficiency   . GERD (gastroesophageal reflux disease)   . Hypothyroid   . Respiratory failure   . Hypertensive heart disease      Past Surgical History  Procedure Laterality Date  . Tracheostomy  05/2014    later surgically closed  . Peg tube placement      Allergies  Allergen Reactions  . Penicillins     I have reviewed the patient's current medications   . norepinephrine (LEVOPHED) Adult infusion 30 mcg/min (2015/01/12 1812)     Prior to Admission medications   Not available    No other information on ROS, meds, social or family history due to pt condition.  Social History   Social History  . Marital Status: N/A    Spouse Name: N/A  . Number of Children: N/A  . Years of Education: N/A   Occupational History  . Not on file.   Social History Main  Topics  . Smoking status: Former Smoker -- 1.00 packs/day for 40 years    Types: Cigarettes    Quit date: 05/17/2014  . Smokeless tobacco: Never Used  . Alcohol Use: Not on file  . Drug Use: Not on file  . Sexual Activity: Not on file   Other Topics Concern  . Not on file   Social History Narrative   Admitted to [facility] from [place], [place].    Family Status  Relation Status Death Age  . Mother Alive   . Brother Alive    ROS:  Not available  Physical Exam: Blood pressure 136/79, pulse 72, resp. rate 26, SpO2 100 %.  General: Cachectic, chronically-ill appearing  male unresponsive on the vent Head: Eyes dilated and fixed. No xanthomas.   Normocephalic and atraumatic, oropharynx without edema or exudate. Dentition: poor Lungs: bilateral rales, some rhonchi Heart: HRRR S1 S2, no rub/gallop, no murmur. pulses are palpable femorally and radially, but are weak.    Neck: No carotid bruits. No lymphadenopathy.  JVD not assessable due to pt condition. Abdomen: Bowel sounds not noted, abdomen soft and non-tender without masses; large scrotal hernia noted. Msk:  Not assessable except by observation. No joint effusions noted Extremities: No clubbing or cyanosis. no edema.  Neuro: unresponsive on the vent Skin: No  rashes; multiple abrasions are noted. Cyanotic extremities with mottling noted  Labs:   ABG    Component Value Date/Time   TCO2 14 01-03-2015 1825   Lab Results  Component Value Date   WBC 10.8* 2015/01/03   HGB 9.2* 01/03/15   HCT 27.0* Jan 03, 2015   MCV 98.7 01-03-15   PLT 143* 01/03/2015     Recent Labs Lab 03-Jan-2015 1825  NA 144  K 4.3  CL 110  BUN 44*  CREATININE 0.90  GLUCOSE 137*    ECG:  No 12-lead available, transcutaneous V pacing with occasional P waves seen  Radiology:  Dg Chest Port 1 View Jan 03, 2015   CLINICAL DATA:  Post CPR with intubation.  EXAM: PORTABLE CHEST - 1 VIEW  COMPARISON:  None.  FINDINGS: Endotracheal tube is present with tip  approximately 5.2 cm above the carina. Lungs are hypoinflated with bilateral central mixed interstitial airspace density which may be partially chronic although cannot exclude acute component of edema versus infection. Small to moderate left-sided pneumothorax. Cardiomediastinal silhouette is within normal. No definite rib fractures seen.  IMPRESSION: Small to moderate left-sided pneumothorax.  Mixed interstitial airspace density over the central lungs which may be partly chronic although cannot exclude an acute process such as edema or infection.  Endotracheal tube with tip 5.2 cm above the carina.  Critical Value/emergent results were called by telephone at the time of interpretation on 01/03/2015 at 6:12 pm to Dr. [name] , nurse, Brigid Re, who verbally acknowledged these results.   Electronically Signed   By: Elberta Fortis M.D.   On: 2015/01/03 18:13    ASSESSMENT AND PLAN:   The patient was seen today by Dr Antoine Poche, the patient evaluated and the data reviewed.    CHB (complete heart block) - pt currently maintaining pulses being transcutaneously paced - with unknown down time and multiple critical medical problems, do not believe temp wire indicated.  - MD advise on further evaluation with cycling enzymes and/or echo  Otherwise, per CCM Active Problems:   Acute respiratory failure   Cardiac arrest   Pneumothorax   Signed: Leanna Battles 03-Jan-2015 6:58 PM Beeper [phone]  Co-Sign MD  History and all data above reviewed.  Patient examined.  I agree with the findings as above.  The patient presented after an unwitnessed arrest.  He has profound acidemia, lactic acidosis and is unresponsive with shock requiring high dose vasopressor support and maximal ventilator support.  He has severe underlying comorbid illnesses. He is transcutaneously paced at present with no underlying pulse off of pacing.  The patient exam reveals COR:RRR (only paced),   ,  Lungs: decreased breath  sounds.   ,  Abd: distended, Ext  Muscle wasting no edema  .  All available labs, radiology testing, previous records reviewed. Agree with documented assessment and plan. Asystole/bradycardia/sepsis:  I was called to assess for a possible temporary pacing wire.  However, the issues is one of profound acidemia and shock.  He is currently capturing adequately with transcutaneous pacing and will not survive if is acidemia is not correctable.  Temporary pacing would not alter his prognosis given the ability to transcutaneously pace is adequate.    Fayrene Fearing Shenise Wolgamott  9:30 PM  January 03, 2015

## 2014-12-28 NOTE — Progress Notes (Signed)
eLink Physician-Brief Progress Note Patient Name: [facility] DOB: Jul 19, 1950 MRN: [id number]   Date of Service  12/17/2014  HPI/Events of Note  Severe metabolic lactic acidosis  eICU Interventions  Add IV bicarb and adjusted vent Lactic acid 14     Intervention Category Major Interventions: Acid-Base disturbance - evaluation and management  Shan Levans 12/28/2014, 9:35 PM

## 2014-12-28 NOTE — Procedures (Signed)
Percutaneous Chest Tube Insertion  Consent: Procedure was performed emergently without consent.  Location:  Left Hemithorax  Medication:  Local anesthesia with 5 mL of lidocaine 1%  Description of Procedure: Thoracic ultrasound was used to bedside to visualize anechoic within the left thorax with absence of lung sliding. Patient was prepped and draped in sterile fashion. Lidocaine was used to anesthetize the patient locally. Finder needle did aspirate here from the left hemithorax. Needle was inserted over the rib into the left hemithorax with aspiration of air. Needle was then directed caudally and guidewire was inserted into the hemithorax. Needle was then removed and skin neck was made with sterile scalpel. Dilator was then passed over the guidewire into the pleural space and removed. Code 14 French pigtail catheter was then inserted over the guidewire into the pleural space directed in a caudal manner as stylette was removed. Catheter was sewn into place. Sterile dressing applied. Catheter was connected to canister at 20 cm water pressure of wall suction.  Post Procedure: Patient remained in the emergency department awaiting stat portable chest x-ray to confirm placement of chest tube in critical condition on ventilator and vasopressor support.

## 2014-12-28 NOTE — ED Notes (Signed)
MD at bedside inserting an A line

## 2014-12-28 NOTE — ED Notes (Signed)
Pig tail chest tube placed to left chest by dr Jamison Neighbor.

## 2014-12-29 ENCOUNTER — Inpatient Hospital Stay (HOSPITAL_COMMUNITY): Payer: Medicare Other

## 2014-12-29 LAB — TROPONIN I: Troponin I: 0.88 ng/mL (ref <0.031)

## 2014-12-29 LAB — LACTIC ACID, PLASMA: LACTIC ACID, VENOUS: 22.7 mmol/L — AB (ref 0.5–2.0)

## 2014-12-29 MED ORDER — CHLORHEXIDINE GLUCONATE 0.12% ORAL RINSE (MEDLINE KIT)
15.0000 mL | Freq: Two times a day (BID) | OROMUCOSAL | Status: DC
  Administered 2014-12-29: 15 mL via OROMUCOSAL

## 2014-12-29 MED ORDER — ANTISEPTIC ORAL RINSE SOLUTION (CORINZ): 7.0000 mL | Freq: Four times a day (QID) | OROMUCOSAL | Status: DC

## 2015-01-03 MED FILL — Medication: Qty: 1 | Status: AC

## 2015-01-12 ENCOUNTER — Telehealth: Payer: Self-pay

## 2015-01-12 NOTE — Telephone Encounter (Signed)
On January 13, 2015 I received a death certificate from [facility]. The death certificate is for burial. The patient is a patient is a patient of [employer], television/film [employer]... The death certificate will be taken to the pulmonary unit for signature this am. On 01/13/15 I received the death certificate from Doctor Craige Cotta who signed the death certificate for Doctor Jamison Neighbor. I got the death certificate ready for pickup and called the funeral home to let them know the death certificate is ready for pickup.

## 2015-01-15 NOTE — Progress Notes (Signed)
Patient was disconnected from ventilator by RN, RT extubated patient per verbal order from Dr. Darrick Penna via [place].

## 2015-01-15 NOTE — Progress Notes (Signed)
2 Nurses, Daine Floras RN and Andreas Blower RN auscultated no heart sounds for 1 minute each. Cardiac time of death at 0300

## 2015-01-15 NOTE — Discharge Summary (Signed)
Death Summary Note:  For a complete accounting of the patient's history and presentation on admission please refer to the admission H&P.  In brief this patient was found down for an unclear length of time at a local long-term acute care facility.  He underwent resuscitation for approximately 6 minutes and was ultimately transferred to our facility after he was underwent a right femoral central venous catheter placement and intubation. He was requiring transcutaneous pacing and vasopressors at the time of transfer.  In the emergency department critical care was consulted for patient's persistent cardiogenic shock and lactic acidosis. The patient did not have purposeful movements. An accurate blood pressure could not be obtained via BP cuff so a femoral arterial line was placed emergently. A left-sided moderate pneumothorax was seen on portable CXR and with the patient's hypoxia a left-sided percutaneous chest tube was placed emergently with lung re-expansion. The patient was assessed by cardiology and given his multiple medical problems thought not to be a good candidate for placement of transvenous pacing. He was transferred to the intensive care unit. I updated the patient's mother and brother via phone repeatedly regarding his worsening clinical status and acidosis despite our best efforts. They agreed to DNR with no escalation of care. The patient's clinical status continued to worsen through the night. The patient succumbed to his illness at 0300 on January 07, 2015.  Diagnoses at Death: 1.  Acute Hypoxic Respiratory Failure 2.  Profound Lactic Acidosis 3.  Metabolic Acidosis 4.  Shock Liver Failure 5.  Cardiogenic Shock 6.  Complete Heart Block 7.  Left Pneumothorax 8.  Chronic Obstructive Pulmonary Disease 9.  Anemia of Chronic Disease 10.  History of Depression 11.  History of Hypothyroidism  Donna Christen. Jamison Neighbor, M.D. [facility] Pager:  [phone] After 3pm or if no  response, call [phone]

## 2015-01-15 NOTE — Progress Notes (Signed)
CRITICAL VALUE ALERT  Critical value received:  Lactic acid, troponin   Date of notification:  12/30/14  Time of notification:  0215  Critical value read back:Yes.    Nurse who received alert:  Alycia Rossetti   MD notified (1st page):  E-Link MD  Time of first page:  0215  Responding MD:  E-Link MD  Time MD responded:  [phone]

## 2015-01-15 DEATH — deceased

## 2016-09-11 IMAGING — CT CT HEAD W/O CM
2 series · 16 of 30 positions shown, 20 images · non-contrast
Comparison: None.

CLINICAL DATA: 64-year-old male status post cardiac arrest and CPR

EXAM:
CT HEAD WITHOUT CONTRAST
TECHNIQUE: Contiguous axial images were obtained from the base of the skull
through the vertex without intravenous contrast.

[Series 201: head w/o, idose (1) · axial · non-contrast · 0.49mm/px · z∈[+69,+199]mm · 13 of 32 slices shown, 17 images]
[im 3/32  brain]
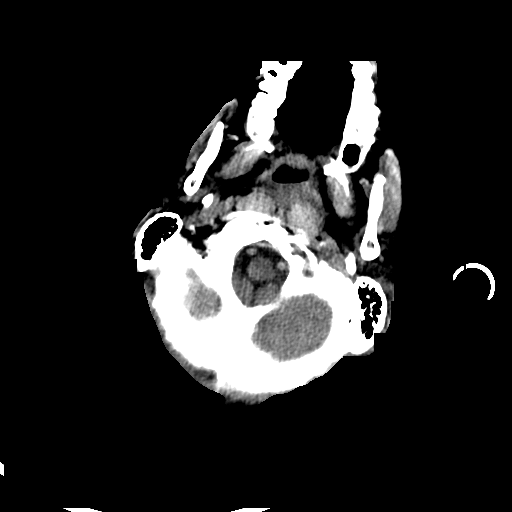
[im 3/32  bone]
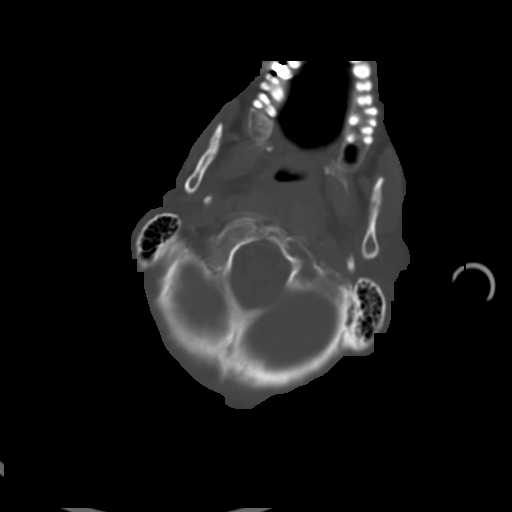
[im 5/32  brain]
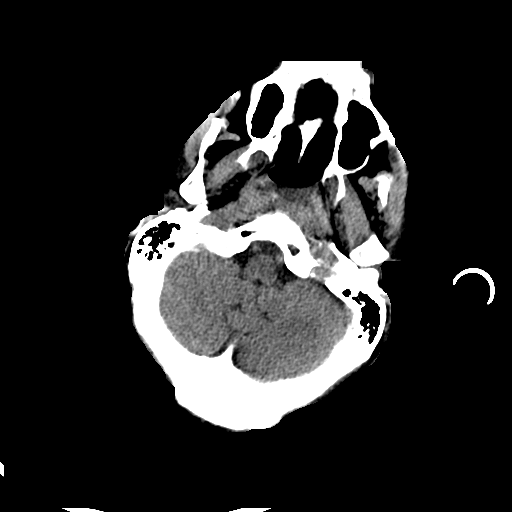
[im 7/32  brain]
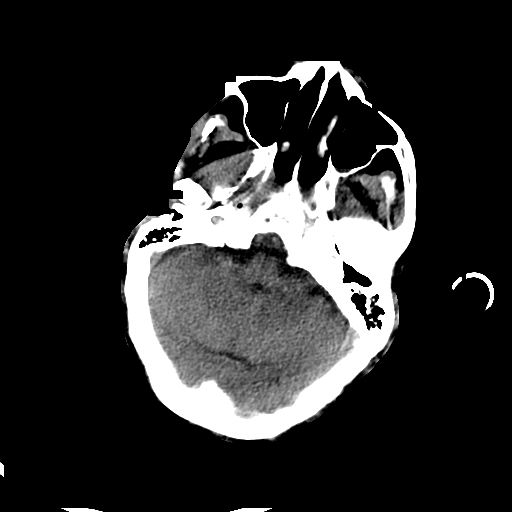
[im 9/32  brain]
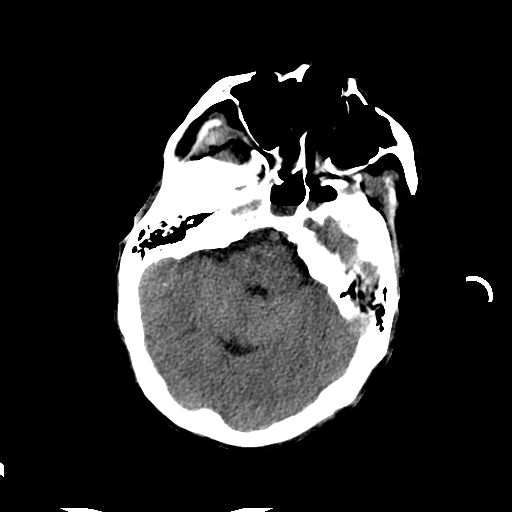
[im 12/32  brain]
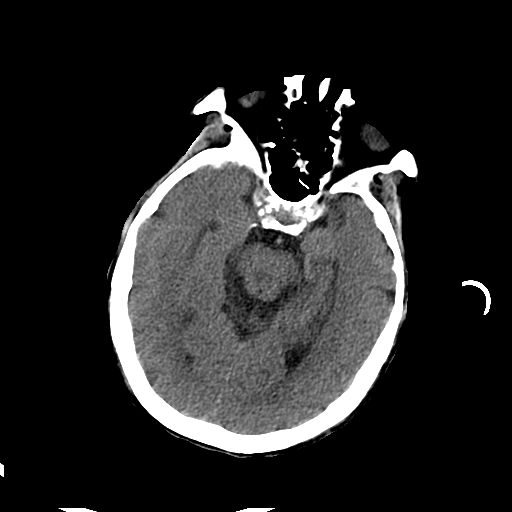
[im 12/32  bone]
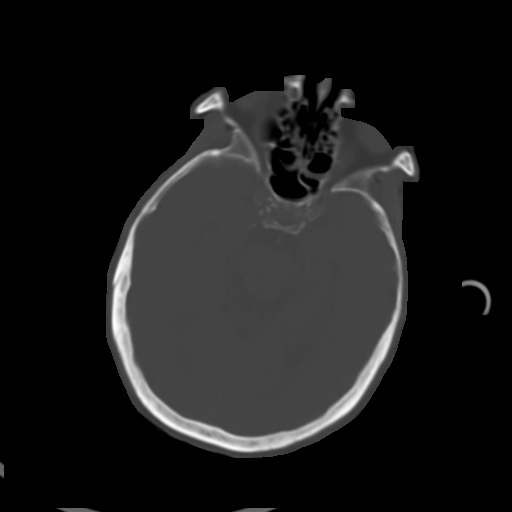
[im 14/32  brain]
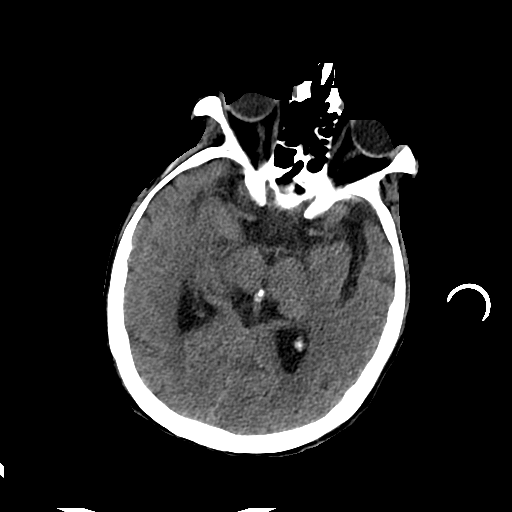
[im 16/32  brain]
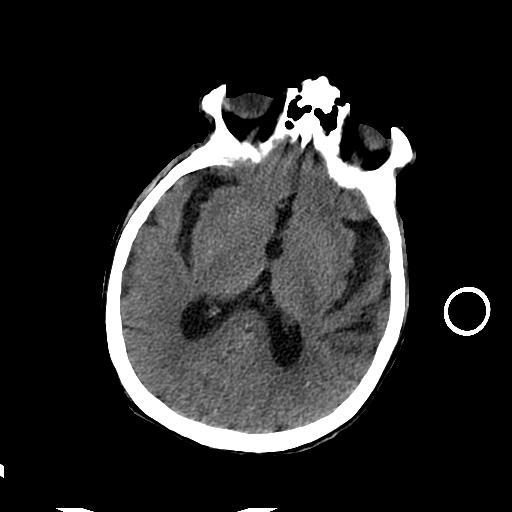
[im 18/32  brain]
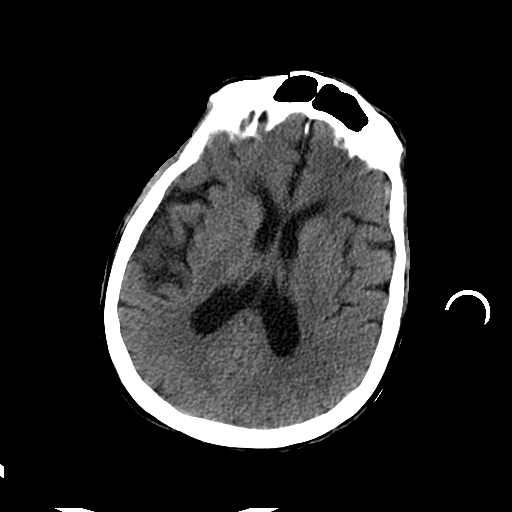
[im 20/32  brain]
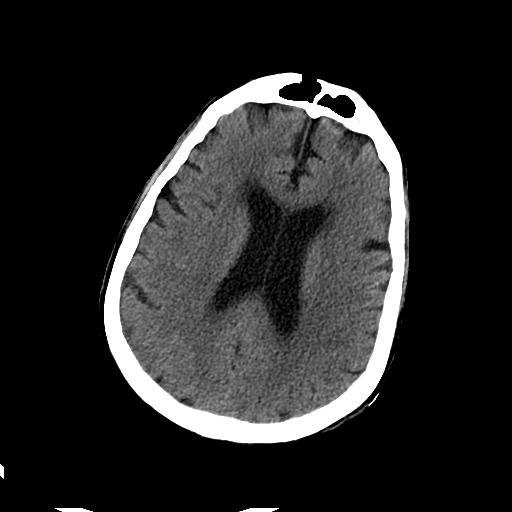
[im 20/32  bone]
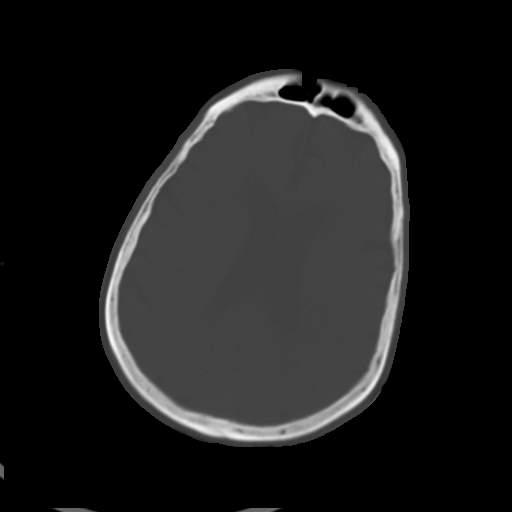
[im 23/32  brain]
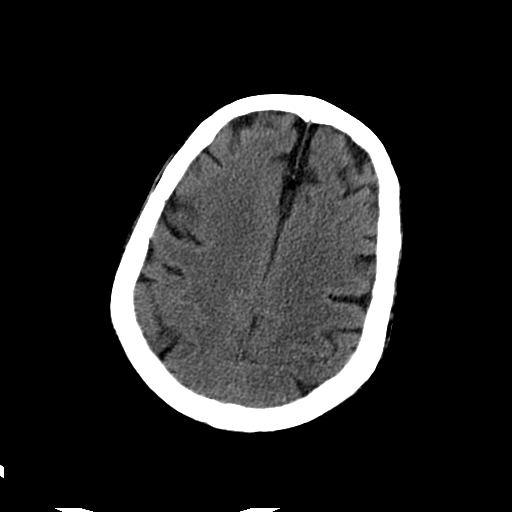
[im 25/32  brain]
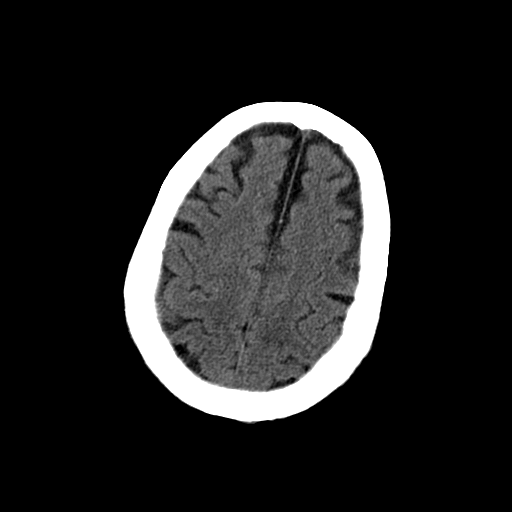
[im 27/32  brain]
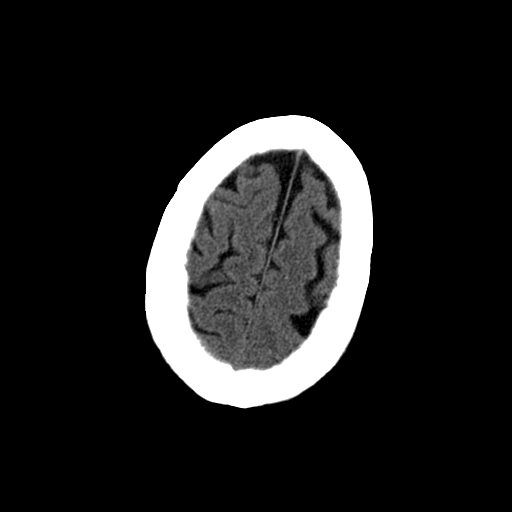
[im 29/32  brain]
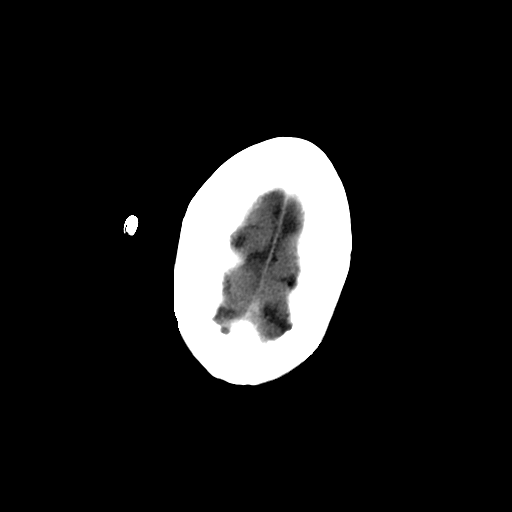
[im 29/32  bone]
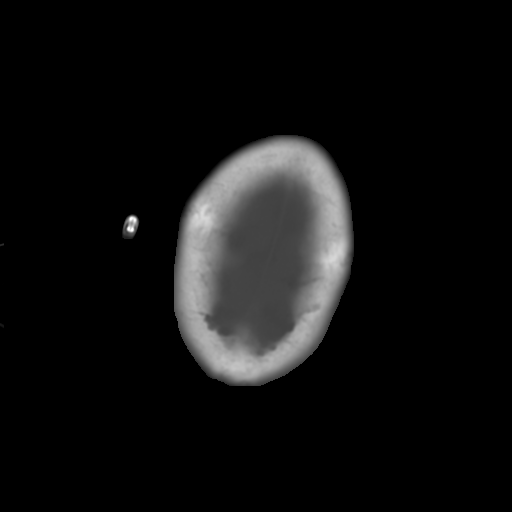

[Series 202: head w/o bone, idose (1) · axial · non-contrast · 0.49mm/px · z∈[+69,+114]mm · 3 of 32 slices shown]
[im 3/32  bone]
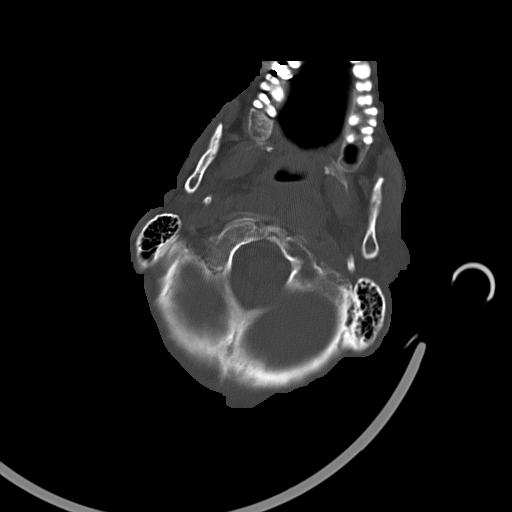
[im 7/32  bone]
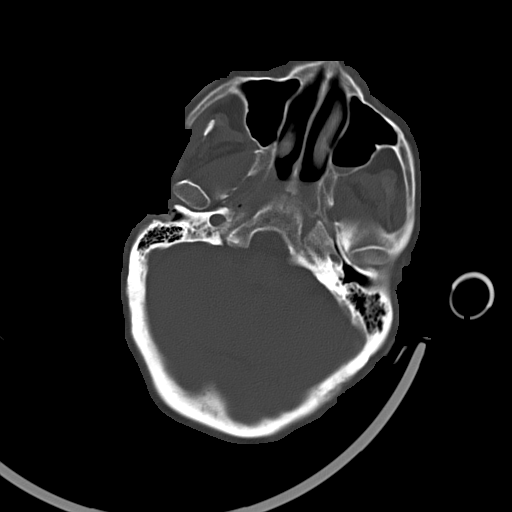
[im 12/32  bone]
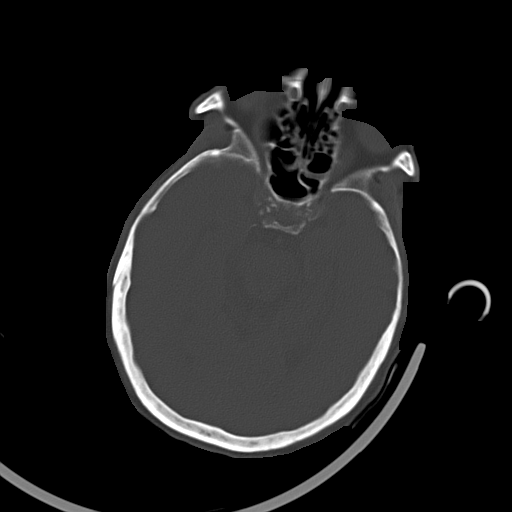

[16 of 30 positions shown; findings below may reference images not displayed]

FINDINGS: There is slight prominence of the ventricles and sulci compatible
with age-related volume loss. Mild periventricular and deep white
matter hypodensities represent chronic microvascular ischemic
changes. There is no intracranial hemorrhage. No mass effect or
midline shift identified.

There is partial opacification of the sphenoid sinuses with minimal
mucoperiosteal thickening of the left maxillary sinus. The calvarium
is intact.
IMPRESSION: No acute intracranial pathology.

Mild age-related atrophy and chronic microvascular ischemic disease.

If symptoms persist and there are no contraindications, MRI may
provide better evaluation if clinically indicated

## 2016-09-11 IMAGING — CR DG CHEST 1V PORT
1 series · 1 of 1 positions shown · non-contrast
Comparison: Earlier radiograph dated 12/28/2014

CLINICAL DATA: 64-year-old male with cardiac arrest and chest tube
placement.

EXAM:
PORTABLE CHEST - 1 VIEW

[AP]
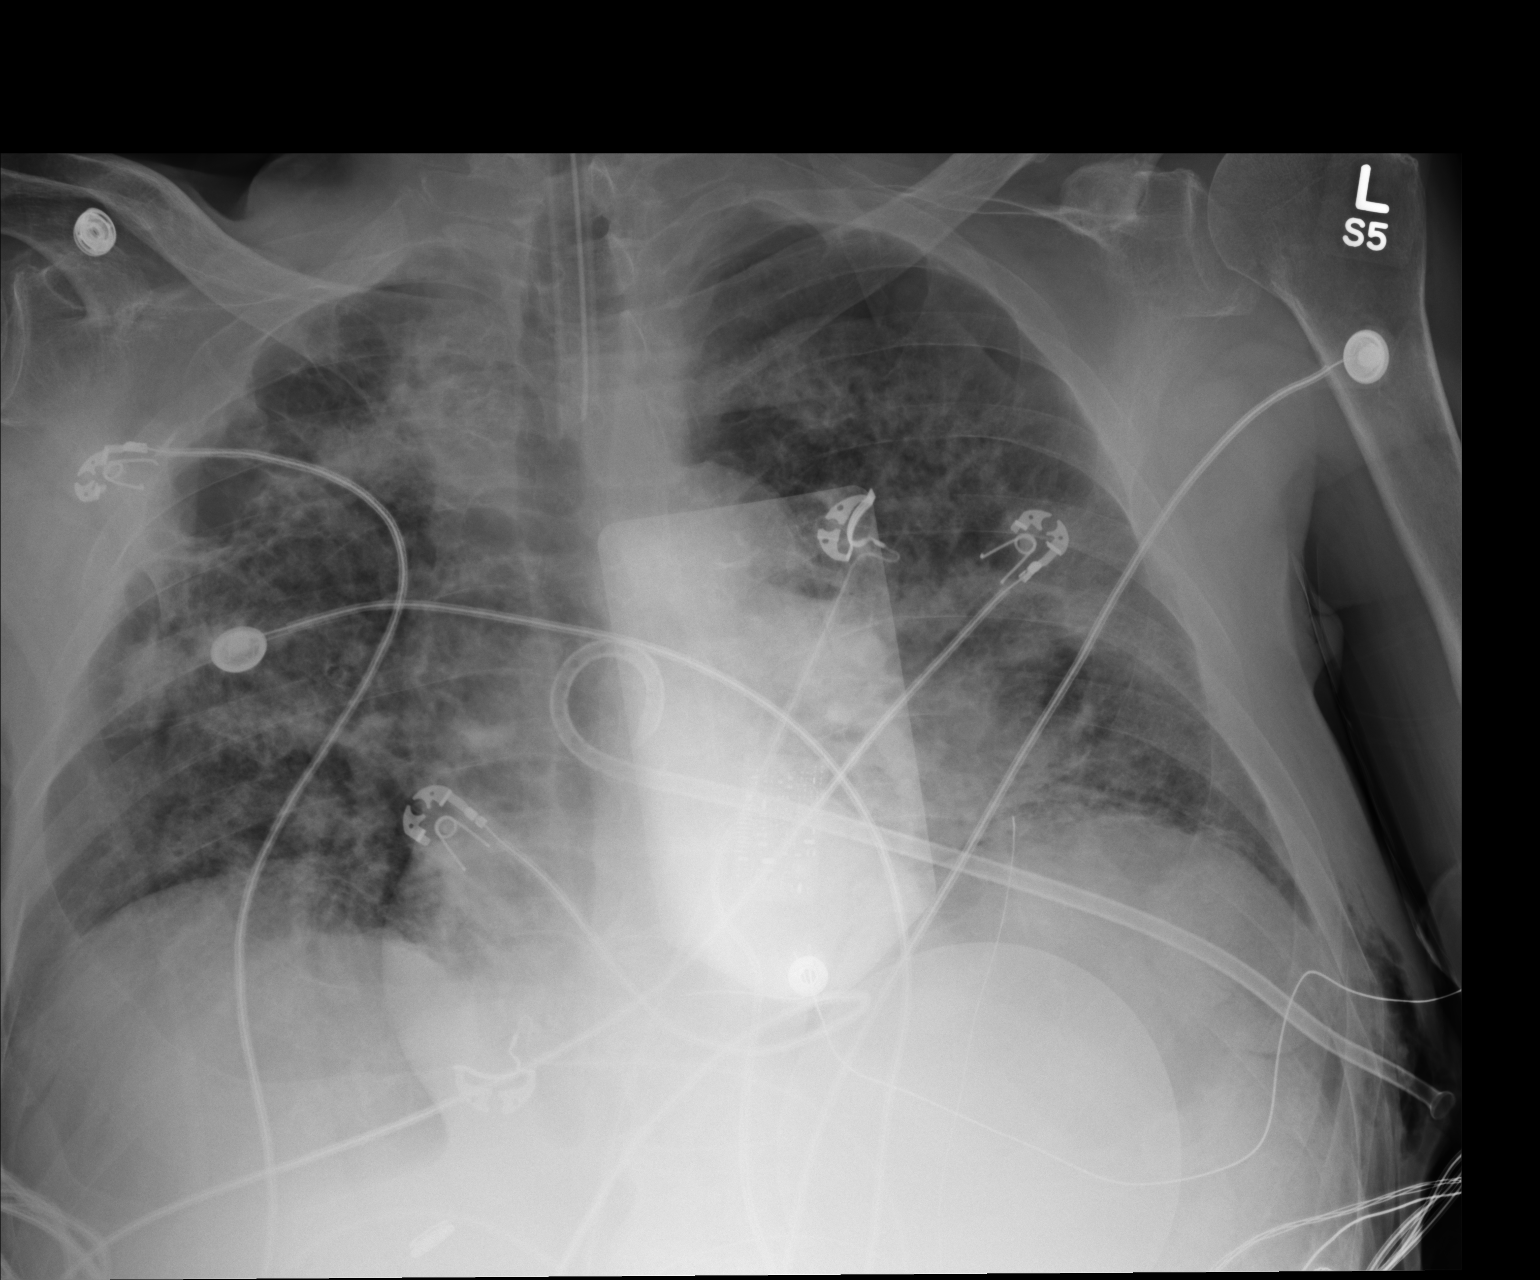

[1 of 1 positions shown; findings below may reference images not displayed]

FINDINGS: An endotracheal tube is noted with tip in stable position above the
carina. There has been interval placement of a pigtail chest tube
with tip projecting over the mediastinum at the level of the carina.
There has been interval improvement of the previously seen
pneumothorax with probable small residual pneumothorax at the left
apex. Follow-up recommended.

Bilateral patchy airspace opacity again noted similar to prior
study. Stable cardiac silhouette. The osseous structures are grossly
unremarkable. Left chest wall soft tissue emphysema adjacent to the
entry of the chest tube.
IMPRESSION: Interval placement of a chest tube with tip projecting over the
mediastinum. There has been interval decrease in the size of the
left pneumothorax with possible residual pneumothorax at the left
apical region. Follow-up recommended.
